# Patient Record
Sex: Male | Born: 1975 | Race: Black or African American | Hispanic: No | Marital: Single | Smoking: Never smoker
Health system: Southern US, Community
[De-identification: ages and names within clinical notes are randomized; demographics above are authoritative.]

---

## 2009-04-13 ENCOUNTER — Ambulatory Visit: Payer: Self-pay | Admitting: Internal Medicine

## 2009-04-16 ENCOUNTER — Ambulatory Visit: Payer: Self-pay | Admitting: Internal Medicine

## 2009-04-28 ENCOUNTER — Ambulatory Visit: Payer: Self-pay | Admitting: Internal Medicine

## 2009-05-03 ENCOUNTER — Ambulatory Visit: Payer: Self-pay | Admitting: Internal Medicine

## 2010-06-14 ENCOUNTER — Ambulatory Visit: Payer: Self-pay | Admitting: Internal Medicine

## 2010-06-29 ENCOUNTER — Ambulatory Visit: Payer: Self-pay | Admitting: Internal Medicine

## 2010-07-08 ENCOUNTER — Ambulatory Visit: Payer: Self-pay | Admitting: Internal Medicine
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
## 2015-07-28 HISTORY — PX: TONSILLECTOMY: SHX5217

## 2015-08-05 HISTORY — PX: OSTEOTOMY: SHX137

## 2020-02-25 ENCOUNTER — Ambulatory Visit (INDEPENDENT_AMBULATORY_CARE_PROVIDER_SITE_OTHER): Payer: PRIVATE HEALTH INSURANCE | Admitting: Internal Medicine

## 2020-02-25 ENCOUNTER — Other Ambulatory Visit: Payer: Self-pay

## 2020-02-25 ENCOUNTER — Encounter: Payer: Self-pay | Admitting: Internal Medicine

## 2020-02-25 VITALS — BP 142/109 | HR 76 | Ht 70.5 in | Wt 258.9 lb

## 2020-02-25 DIAGNOSIS — I658 Occlusion and stenosis of other precerebral arteries: Secondary | ICD-10-CM | POA: Diagnosis not present

## 2020-02-25 DIAGNOSIS — K219 Gastro-esophageal reflux disease without esophagitis: Secondary | ICD-10-CM | POA: Diagnosis not present

## 2020-02-25 DIAGNOSIS — I10 Essential (primary) hypertension: Secondary | ICD-10-CM | POA: Diagnosis not present

## 2020-02-25 DIAGNOSIS — E669 Obesity, unspecified: Secondary | ICD-10-CM | POA: Diagnosis not present

## 2020-02-25 DIAGNOSIS — Z5320 Procedure and treatment not carried out because of patient's decision for unspecified reasons: Secondary | ICD-10-CM

## 2020-02-25 DIAGNOSIS — Z0000 Encounter for general adult medical examination without abnormal findings: Secondary | ICD-10-CM | POA: Diagnosis not present

## 2020-02-25 LAB — POCT URINALYSIS DIPSTICK
Bilirubin, UA: NEGATIVE
Blood, UA: NEGATIVE
Glucose, UA: NEGATIVE
Ketones, UA: NEGATIVE
Leukocytes, UA: NEGATIVE
Nitrite, UA: NEGATIVE
Protein, UA: POSITIVE — AB
Spec Grav, UA: 1.02 (ref 1.010–1.025)
Urobilinogen, UA: 0.2 E.U./dL
pH, UA: 6 (ref 5.0–8.0)

## 2020-02-25 NOTE — Assessment & Plan Note (Signed)
-   The patient's GERD is stable on medication.  - Instructed the patient to avoid eating spicy and acidic foods, as well as foods high in fat. - Instructed the patient to avoid eating large meals or meals 2-3 hours prior to sleeping. 

## 2020-02-25 NOTE — Assessment & Plan Note (Signed)
Patient heart is regular chest is clear abdomen is soft nontender without any hepatosplenomegaly.  There is no calf tenderness or pedal edema.  He is overweight he was advised to lose weight.  He refuses rectal examination.  He was also advised to take Covid shot but  He is not very interested in getting it.  We will try it again next time.

## 2020-02-25 NOTE — Assessment & Plan Note (Signed)
-   I encouraged the patient to lose weight.  - I educated them on making healthy dietary choices including eating more fruits and vegetables and less fried foods. - I encouraged the patient to exercise more, and educated on the benefits of exercise including weight loss, diabetes prevention, and hypertension prevention.   

## 2020-02-25 NOTE — Patient Instructions (Addendum)
Understanding How COVID-19 Vaccines Work Updated Oct 23, 2019  What You Need to Know . COVID-19 vaccines are safe and effective. . You may have side effects after vaccination, but these are normal. . It typically takes two weeks after you are fully vaccinated for the body to build protection (immunity) against the virus that causes COVID-19. . If you are not vaccinated, find a vaccine. Keep taking all precautions until you are fully vaccinated. . If you are fully vaccinated, you can resume activities that you did prior to the pandemic. Learn more about what you can do when you have been fully vaccinated.  Vaccine sticker and vial The Immune System--the Body's Defense Against Infection To understand how COVID-19 vaccines work, it helps to first look at how our bodies fight illness. When germs, such as the virus that causes COVID-19, invade our bodies, they attack and multiply. This invasion, called an infection, is what causes illness. Our immune system uses several tools to fight infection. Blood contains red cells, which carry oxygen to tissues and organs, and white or immune cells, which fight infection. Different types of white blood cells fight infection in different ways:  Marland Kitchen Macrophages are white blood cells that swallow up and digest germs and dead or dying cells. The macrophages leave behind parts of the invading germs, called "antigens". The body identifies antigens as dangerous and stimulates antibodies to attack them. . B-lymphocytes are defensive white blood cells. They produce antibodies that attack the pieces of the virus left behind by the macrophages. . T-lymphocytes are another type of defensive white blood cell. They attack cells in the body that have already been infected. . The first time a person is infected with the virus that causes COVID-19, it can take several days or weeks for their body to make and use all the germ-fighting tools needed to get over the infection. After the  infection, the person's immune system remembers what it learned about how to protect the body against that disease.  The body keeps a few T-lymphocytes, called "memory cells," that go into action quickly if the body encounters the same virus again. When the familiar antigens are detected, B-lymphocytes produce antibodies to attack them. Experts are still learning how long these memory cells protect a person against the virus that causes COVID-19.  How COVID-19 Vaccines Work COVID-19 vaccines help our bodies develop immunity to the virus that causes COVID-19 without [place] having to get the illness.  COVID vaccine Different types of vaccines work in different ways to offer protection. But with all types of vaccines, the body is left with a supply of "memory" T-lymphocytes as well as B-lymphocytes that will remember how to fight that virus in the future.  It typically takes a few weeks after vaccination for the body to produce T-lymphocytes and B-lymphocytes. Therefore, it is possible that a person could be infected with the virus that causes COVID-19 just before or just after vaccination and then get sick because the vaccine did not have enough time to provide protection.  Sometimes after vaccination, the process of building immunity can cause symptoms, such as fever. These symptoms are normal and are signs that the body is building immunity.  Learn more about getting your vaccine.  Types of Vaccines Currently, there are three main types of COVID-19 vaccines that are authorized and recommended or undergoing large-scale (Phase 3) clinical trials in the [place].  Below is a description of how each type of vaccine prompts our bodies to recognize and  protect [place] from the virus that causes COVID-19. None of these vaccines can give you COVID-19.  Marland Kitchen mRNA vaccines contain material from the virus that causes COVID-19 that gives our cells instructions for how to make a harmless protein that is unique to  the virus. After our cells make copies of the protein, they destroy the genetic material from the vaccine. Our bodies recognize that the protein should not be there and build T-lymphocytes and B-lymphocytes that will remember how to fight the virus that causes COVID-19 if we are infected in the future. . Protein subunit vaccines include harmless pieces (proteins) of the virus that causes COVID-19 instead of the entire germ. Once vaccinated, our bodies recognize that the protein should not be there and build T-lymphocytes and antibodies that will remember how to fight the virus that causes COVID-19 if we are infected in the future. . Vector vaccines contain a modified version of a different virus than the one that causes COVID-19. Inside the shell of the modified virus, there is material from the virus that causes COVID-19. This is called a "viral vector." Once the viral vector is inside our cells, the genetic material gives cells instructions to make a protein that is unique to the virus that causes COVID-19. Using these instructions, our cells make copies of the protein. This prompts our bodies to build T-lymphocytes and B-lymphocytes that will remember how to fight that virus if we are infected in the future.  Some COVID-19 Vaccines Require More Than One Shot To be fully vaccinated, you will need two shots of some COVID-19 vaccines.  . Two shots: If you get a COVID-19 vaccine that requires two shots, you are considered fully vaccinated two weeks after your second shot. Pfizer-BioNTech and Moderna COVID-19 vaccines require two shots. . One Shot: If you get a COVID-19 vaccine that requires one shot, you are considered fully vaccinated two weeks after your shot. Johnson & Johnson's [place] Dibbles COVID-19 vaccine only requires one shot. . If it has been less than two weeks since your shot, or if you still need to get your second shot, you are NOT fully protected. Keep taking steps to protect yourself and others  until you are fully vaccinated (two weeks after your final shot).  Do I still need to get the COVID-19 vaccine if I was diagnosed with and recovered from COVID-19? Yes, you should be vaccinated regardless of whether you already had COVID-19 because: . Research has not yet shown how long you are protected from getting COVID-19 again after you recover from COVID-19. . Vaccination helps protect you even if you've already had COVID-19. . Evidence is emerging that people get better protection by being fully vaccinated compared with having had COVID-19. One study showed that unvaccinated people who already had COVID-19 are more than 2 times as likely than fully vaccinated people to get COVID-19 again.  If you were treated for COVID-19 with monoclonal antibodies or convalescent plasma, you should wait 90 days before getting a COVID-19 vaccine. Talk to your doctor if you are unsure what treatments you received or if you have more questions about getting a COVID-19 vaccine.  Where to get the Vaccine You can get a COVID-19 vaccine from any participating pharmacy.  You can also register for a vaccine with [place] at:  [facility] - ages 54 and older; Phone: [phone]   [facility] - ages 41 and older; Phone: [phone]  [facility] - ages 9 and  above; Phone: [phone]  For additional locations and clinics for ages 5 and younger, please visit:  [email or web address]   Information from: [email or web address] https://www.cdc.gov/coronavirus/2019-ncov/vaccines/faq.html#:~:text=No.,the%20criteria%20before%20getting%20vaccinated

## 2020-02-25 NOTE — Assessment & Plan Note (Signed)
-   Today, the patient's blood pressure is well managed on present regime. - The patient will continue the current treatment regimen.  - I encouraged the patient to eat a low-sodium diet to help control blood pressure. - I encouraged the patient to live an active lifestyle and complete activities that increases heart rate to 85% target heart rate at least 5 times per week for one hour.      

## 2020-02-25 NOTE — Assessment & Plan Note (Signed)
Patient did not want me to check on the prostate.  Although he is symptomatic with urgency and frequency of urination

## 2020-02-25 NOTE — Progress Notes (Signed)
Established Patient Office Visit  SUBJECTIVE:  Subjective  Patient ID: Jason Kelly, male    DOB: 1975-09-04  Age: 44 y.o. MRN: [id number]  CC:  Chief Complaint  Patient presents with  . General check up    Patient is here today for a general check up, pt has not been since covid pandemic began and would like to check in with pcp.    HPI Jason Kelly is a 44 y.o. male presenting today for general check up.  He started taking metamucil to help him get extra fiber recently. He has noticed that since then, he has started having increased urgency. He uses the bathroom around 5-6 times per day and has nocturia x2. He drinks a lot of water.   His blood pressure today is 142/109. He notes that he lost weight, but then gained it back as he was eating out a lot while he was remodeling his home. He declines wanting to take medicine and states that he wouldn't take it if prescribed.   He has not gotten vaccinated for [place]. He has some reservations about getting it and is currently on the fence about it.    History reviewed. No pertinent past medical history.  Past Surgical History:  Procedure Laterality Date  . OSTEOTOMY  08/05/2015   Reconscruction midface leforte  . TONSILLECTOMY  07/28/2015    Family History  Problem Relation Age of Onset  . Prostate cancer Father   . Prostate cancer Paternal Uncle     Social History   Socioeconomic History  . Marital status: Single    Spouse name: Not on file  . Number of children: Not on file  . Years of education: Not on file  . Highest education level: Not on file  Occupational History  . Not on file  Tobacco Use  . Smoking status: Never Smoker  . Smokeless tobacco: Never Used  Substance and Sexual Activity  . Alcohol use: Not on file  . Drug use: Not on file  . Sexual activity: Not on file  Other Topics Concern  . Not on file  Social History Narrative  . Not on file   Social Determinants of Health   Financial  Resource Strain:   . Difficulty of Paying Living Expenses: Not on file  Food Insecurity:   . Worried About Charity fundraiser in the Last Year: Not on file  . Ran Out of Food in the Last Year: Not on file  Transportation Needs:   . Lack of Transportation (Medical): Not on file  . Lack of Transportation (Non-Medical): Not on file  Physical Activity:   . Days of Exercise per Week: Not on file  . Minutes of Exercise per Session: Not on file  Stress:   . Feeling of Stress : Not on file  Social Connections:   . Frequency of Communication with Friends and Family: Not on file  . Frequency of Social Gatherings with Friends and Family: Not on file  . Attends Religious Services: Not on file  . Active Member of Clubs or Organizations: Not on file  . Attends Archivist Meetings: Not on file  . Marital Status: Not on file  Intimate Partner Violence:   . Fear of Current or Ex-Partner: Not on file  . Emotionally Abused: Not on file  . Physically Abused: Not on file  . Sexually Abused: Not on file    No current outpatient medications on file.   No Known Allergies  ROS Review of Systems  Constitutional: Negative.   HENT: Negative.   Eyes: Negative.   Respiratory: Positive for chest tightness (occasional).   Cardiovascular: Negative.   Gastrointestinal: Negative.   Endocrine: Negative.   Genitourinary: Positive for urgency. Negative for hematuria.  Musculoskeletal: Negative.   Skin: Negative.   Allergic/Immunologic: Negative.   Neurological: Negative.   Hematological: Negative.   Psychiatric/Behavioral: Negative.   All other systems reviewed and are negative.    OBJECTIVE:    Physical Exam Vitals reviewed.  Constitutional:      Appearance: Normal appearance. He is obese.  HENT:     Mouth/Throat:     Mouth: Mucous membranes are moist.  Eyes:     Pupils: Pupils are equal, round, and reactive to light.  Neck:     Vascular: No carotid bruit.  Cardiovascular:      Rate and Rhythm: Normal rate and regular rhythm.     Pulses: Normal pulses.     Heart sounds: Normal heart sounds.  Pulmonary:     Effort: Pulmonary effort is normal.     Breath sounds: Normal breath sounds.  Abdominal:     General: Bowel sounds are normal.     Palpations: Abdomen is soft. There is no hepatomegaly, splenomegaly or mass.     Tenderness: There is no abdominal tenderness.     Hernia: No hernia is present.  Musculoskeletal:     Cervical back: Neck supple.     Right lower leg: No edema.     Left lower leg: No edema.  Skin:    Findings: No rash.  Neurological:     Mental Status: He is alert and oriented to person, place, and time.     Motor: No weakness.  Psychiatric:        Mood and Affect: Mood normal.        Behavior: Behavior normal.     BP (!) 142/109   Pulse 76   Ht 5' 10.5" (1.791 m)   Wt 258 lb 14.4 oz (117.4 kg)   BMI 36.62 kg/m  Wt Readings from Last 3 Encounters:  02/25/20 258 lb 14.4 oz (117.4 kg)    Health Maintenance Due  Topic Date Due  . Hepatitis C Screening  Never done  . COVID-19 Vaccine (1) Never done  . HIV Screening  Never done  . TETANUS/TDAP  12/13/2009  . INFLUENZA VACCINE  Never done    There are no preventive care reminders to display for this patient.  No flowsheet data found. No flowsheet data found.  No results found for: TSH No results found for: ALBUMIN, ANIONGAP, EGFR, GFR No results found for: CHOL, HDL, LDLCALC, CHOLHDL No results found for: TRIG No results found for: HGBA1C    ASSESSMENT & PLAN:   Problem List Items Addressed This Visit      Cardiovascular and Mediastinum   Essential hypertension - Primary    - Today, the patient's blood pressure is well managed on present regime. - The patient will continue the current treatment regimen.  - I encouraged the patient to eat a low-sodium diet to help control blood pressure. - I encouraged the patient to live an active lifestyle and complete activities that  increases heart rate to 85% target heart rate at least 5 times per week for one hour.          Relevant Orders   CBC with Differential/Platelet   COMPLETE METABOLIC PANEL WITH GFR   Hemoglobin A1c   TSH  PSA   POCT urinalysis dipstick (Completed)     Digestive   Gastroesophageal reflux disease without esophagitis    - The patient's GERD is stable on medication.  - Instructed the patient to avoid eating spicy and acidic foods, as well as foods high in fat. - Instructed the patient to avoid eating large meals or meals 2-3 hours prior to sleeping.       Relevant Orders   CBC with Differential/Platelet   COMPLETE METABOLIC PANEL WITH GFR   Hemoglobin A1c   TSH   PSA     Nervous and Auditory   Raymond-Cestan syndrome    Refer to neurology for follow-up on that.  Patient was advised to lose weight.        Other   Obesity (BMI 35.0-39.9 without comorbidity)    - I encouraged the patient to lose weight.  - I educated them on making healthy dietary choices including eating more fruits and vegetables and less fried foods. - I encouraged the patient to exercise more, and educated on the benefits of exercise including weight loss, diabetes prevention, and hypertension prevention.        Relevant Orders   CBC with Differential/Platelet   COMPLETE METABOLIC PANEL WITH GFR   Hemoglobin A1c   TSH   PSA   POCT urinalysis dipstick (Completed)   Prostate cancer screening declined    Patient did not want me to check on the prostate.  Although he is symptomatic with urgency and frequency of urination      Relevant Orders   POCT urinalysis dipstick (Completed)   Encounter for annual health examination    Patient heart is regular chest is clear abdomen is soft nontender without any hepatosplenomegaly.  There is no calf tenderness or pedal edema.  He is overweight he was advised to lose weight.  He refuses rectal examination.  He was also advised to take Covid shot but  He is not  very interested in getting it.  We will try it again next time.      Relevant Orders   CBC with Differential/Platelet   COMPLETE METABOLIC PANEL WITH GFR   Hemoglobin A1c   TSH   PSA   POCT urinalysis dipstick (Completed)      No orders of the defined types were placed in this encounter.   Follow-up: No follow-ups on file.    Cletis Athens, MD [employer] [address] [place], [place] [address]   By signing my name below, I, [employer], attest that this documentation has been prepared under the direction and in the presence of Dr. Cletis Athens Electronically Signed: Cletis Athens, MD 02/25/20, 6:48 PM  I personally performed the services described in this documentation, which was SCRIBED in my presence. The recorded information has been reviewed and considered accurate. It has been edited as necessary during review. Cletis Athens, MD

## 2020-02-25 NOTE — Assessment & Plan Note (Signed)
Refer to neurology for follow-up on that.  Patient was advised to lose weight.

## 2020-02-26 LAB — CBC WITH DIFFERENTIAL/PLATELET
Absolute Monocytes: 368 cells/uL (ref 200–950)
Basophils Absolute: 49 cells/uL (ref 0–200)
Basophils Relative: 1 %
Eosinophils Absolute: 176 cells/uL (ref 15–500)
Eosinophils Relative: 3.6 %
HCT: 42.2 % (ref 38.5–50.0)
Hemoglobin: 14.5 g/dL (ref 13.2–17.1)
Lymphs Abs: 2102 cells/uL (ref 850–3900)
MCH: 30.2 pg (ref 27.0–33.0)
MCHC: 34.4 g/dL (ref 32.0–36.0)
MCV: 87.9 fL (ref 80.0–100.0)
MPV: 10.3 fL (ref 7.5–12.5)
Monocytes Relative: 7.5 %
Neutro Abs: 2205 cells/uL (ref 1500–7800)
Neutrophils Relative %: 45 %
Platelets: 407 10*3/uL — ABNORMAL HIGH (ref 140–400)
RBC: 4.8 10*6/uL (ref 4.20–5.80)
RDW: 14.4 % (ref 11.0–15.0)
Total Lymphocyte: 42.9 %
WBC: 4.9 10*3/uL (ref 3.8–10.8)

## 2020-02-26 LAB — PSA: PSA: 0.83 ng/mL (ref <=4.0)

## 2020-02-26 LAB — COMPLETE METABOLIC PANEL WITH GFR
AG Ratio: 1.7 (calc) (ref 1.0–2.5)
ALT: 58 U/L — ABNORMAL HIGH (ref 9–46)
AST: 39 U/L (ref 10–40)
Albumin: 4.7 g/dL (ref 3.6–5.1)
Alkaline phosphatase (APISO): 91 U/L (ref 36–130)
BUN: 9 mg/dL (ref 7–25)
CO2: 26 mmol/L (ref 20–32)
Calcium: 9.4 mg/dL (ref 8.6–10.3)
Chloride: 102 mmol/L (ref 98–110)
Creat: 0.93 mg/dL (ref 0.60–1.35)
GFR, Est African American: 115 mL/min/{1.73_m2} (ref >=60)
GFR, Est Non African American: 99 mL/min/{1.73_m2} (ref >=60)
Globulin: 2.8 g/dL (calc) (ref 1.9–3.7)
Glucose, Bld: 105 mg/dL — ABNORMAL HIGH (ref 65–99)
Potassium: 4.4 mmol/L (ref 3.5–5.3)
Sodium: 138 mmol/L (ref 135–146)
Total Bilirubin: 0.8 mg/dL (ref 0.2–1.2)
Total Protein: 7.5 g/dL (ref 6.1–8.1)

## 2020-02-26 LAB — TSH: TSH: 1.18 mIU/L (ref 0.40–4.50)

## 2020-02-26 LAB — HEMOGLOBIN A1C
Hgb A1c MFr Bld: 5.3 % of total Hgb (ref <5.7)
Mean Plasma Glucose: 105 (calc)
eAG (mmol/L): 5.8 (calc)

## 2020-03-23 ENCOUNTER — Encounter: Payer: Self-pay | Admitting: Internal Medicine

## 2020-03-23 ENCOUNTER — Other Ambulatory Visit: Payer: Self-pay

## 2020-03-23 ENCOUNTER — Ambulatory Visit (INDEPENDENT_AMBULATORY_CARE_PROVIDER_SITE_OTHER): Payer: PRIVATE HEALTH INSURANCE | Admitting: Internal Medicine

## 2020-03-23 ENCOUNTER — Ambulatory Visit: Payer: PRIVATE HEALTH INSURANCE | Admitting: Internal Medicine

## 2020-03-23 VITALS — BP 148/93 | HR 69 | Ht 70.0 in | Wt 252.4 lb

## 2020-03-23 DIAGNOSIS — E669 Obesity, unspecified: Secondary | ICD-10-CM | POA: Diagnosis not present

## 2020-03-23 DIAGNOSIS — Z0000 Encounter for general adult medical examination without abnormal findings: Secondary | ICD-10-CM

## 2020-03-23 DIAGNOSIS — R800 Isolated proteinuria: Secondary | ICD-10-CM | POA: Diagnosis not present

## 2020-03-23 DIAGNOSIS — I10 Essential (primary) hypertension: Secondary | ICD-10-CM

## 2020-03-23 DIAGNOSIS — Z5320 Procedure and treatment not carried out because of patient's decision for unspecified reasons: Secondary | ICD-10-CM

## 2020-03-23 DIAGNOSIS — K219 Gastro-esophageal reflux disease without esophagitis: Secondary | ICD-10-CM

## 2020-03-23 LAB — POCT URINALYSIS DIPSTICK
Appearance: NORMAL
Bilirubin, UA: NEGATIVE
Blood, UA: NEGATIVE
Glucose, UA: NEGATIVE
Ketones, UA: NEGATIVE
Leukocytes, UA: NEGATIVE
Nitrite, UA: NEGATIVE
Protein, UA: POSITIVE — AB
Spec Grav, UA: 1.015 (ref 1.010–1.025)
Urobilinogen, UA: NEGATIVE E.U./dL — AB
pH, UA: 7.5 (ref 5.0–8.0)

## 2020-03-23 NOTE — Assessment & Plan Note (Signed)
PSA is normal.

## 2020-03-23 NOTE — Assessment & Plan Note (Signed)
-   I encouraged the patient to lose weight.  - I educated them on making healthy dietary choices including eating more fruits and vegetables and less fried foods. - I encouraged the patient to exercise more, and educated on the benefits of exercise including weight loss, diabetes prevention, and hypertension prevention.   

## 2020-03-23 NOTE — Assessment & Plan Note (Signed)
Physical exam the patient is normal neck is supple jugular venous pressure is not elevated on examination of the cardiovascular system heart is regular respiratory system exam revealed no rales or rhonchi abdominal obese nontender without any hepatosplenomegaly.  There is no pedal edema no calf tenderness.  Patient was advised to continue making efforts to lose weight.

## 2020-03-23 NOTE — Assessment & Plan Note (Signed)
Patient has a 1+ protein in the urine we will check it again today.  Pathophysiology of proteinuria was explained to the patient.

## 2020-03-23 NOTE — Assessment & Plan Note (Signed)
Reflux is stable at the present time.

## 2020-03-23 NOTE — Assessment & Plan Note (Signed)
-   Today, the patient's blood pressure is well managed on diet. - The patient will continue the current treatment regimen.  - I encouraged the patient to eat a low-sodium diet to help control blood pressure. - I encouraged the patient to live an active lifestyle and complete activities that increases heart rate to 85% target heart rate at least 5 times per week for one hour.     

## 2020-03-23 NOTE — Progress Notes (Signed)
Established Patient Office Visit  Subjective:  Patient ID: Jason Kelly, male    DOB: September 12, 1975  Age: 44 y.o. MRN: [id number]  CC:  Chief Complaint  Patient presents with  . lab results    HPI  Jason Kelly presents for lab tests evaluation  No past medical history on file.  Past Surgical History:  Procedure Laterality Date  . OSTEOTOMY  08/05/2015   Reconscruction midface leforte  . TONSILLECTOMY  07/28/2015    Family History  Problem Relation Age of Onset  . Prostate cancer Father   . Prostate cancer Paternal Uncle     Social History   Socioeconomic History  . Marital status: Single    Spouse name: Not on file  . Number of children: Not on file  . Years of education: Not on file  . Highest education level: Not on file  Occupational History  . Not on file  Tobacco Use  . Smoking status: Never Smoker  . Smokeless tobacco: Never Used  Substance and Sexual Activity  . Alcohol use: Not on file  . Drug use: Not on file  . Sexual activity: Not on file  Other Topics Concern  . Not on file  Social History Narrative  . Not on file   Social Determinants of Health   Financial Resource Strain:   . Difficulty of Paying Living Expenses: Not on file  Food Insecurity:   . Worried About Programme researcher, broadcasting/film/video in the Last Year: Not on file  . Ran Out of Food in the Last Year: Not on file  Transportation Needs:   . Lack of Transportation (Medical): Not on file  . Lack of Transportation (Non-Medical): Not on file  Physical Activity:   . Days of Exercise per Week: Not on file  . Minutes of Exercise per Session: Not on file  Stress:   . Feeling of Stress : Not on file  Social Connections:   . Frequency of Communication with Friends and Family: Not on file  . Frequency of Social Gatherings with Friends and Family: Not on file  . Attends Religious Services: Not on file  . Active Member of Clubs or Organizations: Not on file  . Attends Banker  Meetings: Not on file  . Marital Status: Not on file  Intimate Partner Violence:   . Fear of Current or Ex-Partner: Not on file  . Emotionally Abused: Not on file  . Physically Abused: Not on file  . Sexually Abused: Not on file    No current outpatient medications on file.   No Known Allergies  ROS Review of Systems  Constitutional: Negative.   HENT: Negative.   Eyes: Negative.   Respiratory: Negative.   Cardiovascular: Negative.   Gastrointestinal: Negative.   Endocrine: Negative.   Genitourinary: Negative.   Musculoskeletal: Negative.   Skin: Negative.   Allergic/Immunologic: Negative.   Neurological: Negative.   Hematological: Negative.   Psychiatric/Behavioral: Negative.   All other systems reviewed and are negative.     Objective:    Physical Exam Vitals reviewed.  Constitutional:      Appearance: Normal appearance.  HENT:     Mouth/Throat:     Mouth: Mucous membranes are moist.  Eyes:     Pupils: Pupils are equal, round, and reactive to light.  Neck:     Vascular: No carotid bruit.  Cardiovascular:     Rate and Rhythm: Normal rate and regular rhythm.     Pulses: Normal pulses.  Heart sounds: Normal heart sounds.  Pulmonary:     Effort: Pulmonary effort is normal.     Breath sounds: Normal breath sounds.  Abdominal:     General: Bowel sounds are normal.     Palpations: Abdomen is soft. There is no hepatomegaly, splenomegaly or mass.     Tenderness: There is no abdominal tenderness.     Hernia: No hernia is present.  Musculoskeletal:     Cervical back: Neck supple.     Right lower leg: No edema.     Left lower leg: No edema.  Skin:    Findings: No rash.  Neurological:     Mental Status: He is alert and oriented to person, place, and time.     Motor: No weakness.  Psychiatric:        Mood and Affect: Mood normal.        Behavior: Behavior normal.     BP (!) 148/93   Pulse 69   Ht 5\' 10"  (1.778 m)   Wt 252 lb 6.4 oz (114.5 kg)   BMI  36.22 kg/m  Wt Readings from Last 3 Encounters:  03/23/20 252 lb 6.4 oz (114.5 kg)  02/25/20 258 lb 14.4 oz (117.4 kg)     Health Maintenance Due  Topic Date Due  . Hepatitis C Screening  Never done  . COVID-19 Vaccine (1) Never done  . HIV Screening  Never done  . TETANUS/TDAP  12/13/2009  . INFLUENZA VACCINE  Never done    There are no preventive care reminders to display for this patient.  Lab Results  Component Value Date   TSH 1.18 02/25/2020   Lab Results  Component Value Date   WBC 4.9 02/25/2020   HGB 14.5 02/25/2020   HCT 42.2 02/25/2020   MCV 87.9 02/25/2020   PLT 407 (H) 02/25/2020   Lab Results  Component Value Date   NA 138 02/25/2020   K 4.4 02/25/2020   CO2 26 02/25/2020   GLUCOSE 105 (H) 02/25/2020   BUN 9 02/25/2020   CREATININE 0.93 02/25/2020   BILITOT 0.8 02/25/2020   AST 39 02/25/2020   ALT 58 (H) 02/25/2020   PROT 7.5 02/25/2020   CALCIUM 9.4 02/25/2020   No results found for: CHOL No results found for: HDL No results found for: LDLCALC No results found for: TRIG No results found for: CHOLHDL Lab Results  Component Value Date   HGBA1C 5.3 02/25/2020      Assessment & Plan:   Problem List Items Addressed This Visit      Cardiovascular and Mediastinum   Essential hypertension - Primary    - Today, the patient's blood pressure is well managed on diet. - The patient will continue the current treatment regimen.  - I encouraged the patient to eat a low-sodium diet to help control blood pressure. - I encouraged the patient to live an active lifestyle and complete activities that increases heart rate to 85% target heart rate at least 5 times per week for one hour.            Digestive   Gastroesophageal reflux disease without esophagitis    Reflux is stable at the present time.        Other   Obesity (BMI 35.0-39.9 without comorbidity)   Prostate cancer screening declined    PSA is normal.      Isolated proteinuria  without specific morphologic lesion    Patient has a 1+ protein in the urine we will  check it again today.  Pathophysiology of proteinuria was explained to the patient.         No orders of the defined types were placed in this encounter.  Patient was advised to lose weight.  He was also advised to decrease the alcohol drinking 4 drinks per week.  Patient was advised to check his urine again in 3 months.  Lab tests were normal except for a mildly elevated transaminases which is due to drinking.  Blood sugar is 105.  He was told that he could be prediabetic and needs to lose weight.  Patient still have protein in the urine  So I suggested that he should check with the nephrologist f0r proteinuria.    Corky Downs, MD

## 2020-03-24 ENCOUNTER — Ambulatory Visit: Payer: PRIVATE HEALTH INSURANCE | Admitting: Internal Medicine

## 2021-10-21 ENCOUNTER — Encounter: Payer: Self-pay | Admitting: Nurse Practitioner

## 2021-10-21 ENCOUNTER — Ambulatory Visit (INDEPENDENT_AMBULATORY_CARE_PROVIDER_SITE_OTHER): Payer: No Typology Code available for payment source | Admitting: Nurse Practitioner

## 2021-10-21 VITALS — BP 170/114 | HR 95 | Ht 70.0 in | Wt 263.0 lb

## 2021-10-21 DIAGNOSIS — M109 Gout, unspecified: Secondary | ICD-10-CM | POA: Diagnosis not present

## 2021-10-21 MED ORDER — TRAMADOL HCL 50 MG PO TABS: 100.0000 mg | ORAL_TABLET | Freq: Two times a day (BID) | ORAL | 0 refills | Status: AC | PRN

## 2021-10-21 MED ORDER — COLCHICINE 0.6 MG PO TABS: 0.6000 mg | ORAL_TABLET | Freq: Every day | ORAL | 1 refills | Status: AC

## 2021-10-21 MED ORDER — KETOROLAC TROMETHAMINE 30 MG/ML IJ SOLN: 30.0000 mg | Freq: Once | INTRAMUSCULAR | Status: AC

## 2021-10-21 MED ORDER — METHYLPREDNISOLONE 4 MG PO TBPK: ORAL_TABLET | ORAL | 0 refills | Status: AC

## 2021-10-21 NOTE — Progress Notes (Unsigned)
Established Patient Office Visit  Subjective:  Patient ID: Jason Kelly, male    DOB: [birth year]  Age: 46 y.o. MRN: [id number]  CC:  Chief Complaint  Patient presents with   Gout    Patient complains of gout flare up on left foot, patient states he ate gizzards last week. Difficulty walking for the past 2 days. He has tried numerous remedies at home.      HPI  Jason Kelly presents for:  HPI   No past medical history on file.  Past Surgical History:  Procedure Laterality Date   OSTEOTOMY  08/05/2015   Reconscruction midface leforte   TONSILLECTOMY  07/28/2015    Family History  Problem Relation Age of Onset   Prostate cancer Father    Prostate cancer Paternal Uncle     Social History   Socioeconomic History   Marital status: Single    Spouse name: Not on file   Number of children: Not on file   Years of education: Not on file   Highest education level: Not on file  Occupational History   Not on file  Tobacco Use   Smoking status: Never   Smokeless tobacco: Never  Substance and Sexual Activity   Alcohol use: Not on file   Drug use: Not on file   Sexual activity: Not on file  Other Topics Concern   Not on file  Social History Narrative   Not on file   Social Determinants of Health   Financial Resource Strain: Not on file  Food Insecurity: Not on file  Transportation Needs: Not on file  Physical Activity: Not on file  Stress: Not on file  Social Connections: Not on file  Intimate Partner Violence: Not on file     No outpatient medications prior to visit.   No facility-administered medications prior to visit.    No Known Allergies  ROS Review of Systems    Objective:    Physical Exam  BP (!) 170/114   Pulse 95   Ht 5\' 10"  (1.778 m)   Wt 263 lb (119.3 kg)   BMI 37.74 kg/m  Wt Readings from Last 3 Encounters:  10/21/21 263 lb (119.3 kg)  03/23/20 252 lb 6.4 oz (114.5 kg)  02/25/20 258 lb 14.4 oz (117.4 kg)     Health  Maintenance Due  Topic Date Due   COVID-19 Vaccine (1) Never done   HIV Screening  Never done   Hepatitis C Screening  Never done   TETANUS/TDAP  12/13/2009   COLONOSCOPY (Pts 45-53yrs Insurance coverage will need to be confirmed)  Never done    There are no preventive care reminders to display for this patient.  Lab Results  Component Value Date   TSH 1.18 02/25/2020   Lab Results  Component Value Date   WBC 4.9 02/25/2020   HGB 14.5 02/25/2020   HCT 42.2 02/25/2020   MCV 87.9 02/25/2020   PLT 407 (H) 02/25/2020   Lab Results  Component Value Date   NA 138 02/25/2020   K 4.4 02/25/2020   CO2 26 02/25/2020   GLUCOSE 105 (H) 02/25/2020   BUN 9 02/25/2020   CREATININE 0.93 02/25/2020   BILITOT 0.8 02/25/2020   AST 39 02/25/2020   ALT 58 (H) 02/25/2020   PROT 7.5 02/25/2020   CALCIUM 9.4 02/25/2020   No results found for: CHOL No results found for: HDL No results found for: LDLCALC No results found for: TRIG No results found for: CHOLHDL  Lab Results  Component Value Date   HGBA1C 5.3 02/25/2020      Assessment & Plan:   Problem List Items Addressed This Visit   None    No orders of the defined types were placed in this encounter.    Follow-up: No follow-ups on file.    Kara Dies, NP

## 2021-10-24 DIAGNOSIS — M109 Gout, unspecified: Secondary | ICD-10-CM | POA: Insufficient documentation

## 2021-10-24 NOTE — Assessment & Plan Note (Signed)
Toradol injection administered in the office today. Started patient on colchicine, Medrol Dosepak and tramadol.

## 2024-01-16 ENCOUNTER — Other Ambulatory Visit: Payer: Self-pay | Admitting: Medical Genetics

## 2024-03-19 ENCOUNTER — Other Ambulatory Visit: Payer: Self-pay | Admitting: Medical Genetics

## 2024-03-19 DIAGNOSIS — Z006 Encounter for examination for normal comparison and control in clinical research program: Secondary | ICD-10-CM
